# Patient Record
Sex: Female | Born: 1935 | Race: White | Hispanic: No | Marital: Single | Smoking: Never smoker
Health system: Southern US, Community
[De-identification: ages and names within clinical notes are randomized; demographics above are authoritative.]

## PROBLEM LIST (undated history)

## (undated) DIAGNOSIS — M797 Fibromyalgia: Secondary | ICD-10-CM

## (undated) DIAGNOSIS — N23 Unspecified renal colic: Secondary | ICD-10-CM

## (undated) DIAGNOSIS — M4800 Spinal stenosis, site unspecified: Secondary | ICD-10-CM

## (undated) DIAGNOSIS — M1990 Unspecified osteoarthritis, unspecified site: Secondary | ICD-10-CM

## (undated) DIAGNOSIS — M25569 Pain in unspecified knee: Secondary | ICD-10-CM

## (undated) DIAGNOSIS — G8929 Other chronic pain: Secondary | ICD-10-CM

## (undated) HISTORY — PX: HEMORRHOID SURGERY: SHX153

## (undated) HISTORY — PX: ABDOMINAL HYSTERECTOMY: SHX81

## (undated) HISTORY — PX: APPENDECTOMY: SHX54

---

## 2010-08-16 DEATH — deceased

## 2010-10-22 ENCOUNTER — Encounter: Payer: Self-pay | Admitting: Emergency Medicine

## 2010-10-22 ENCOUNTER — Emergency Department (HOSPITAL_BASED_OUTPATIENT_CLINIC_OR_DEPARTMENT_OTHER)
Admission: EM | Admit: 2010-10-22 | Discharge: 2010-10-22 | Disposition: A | Discharge status: Home / Self Care | Payer: Medicare Other | Attending: Emergency Medicine | Admitting: Emergency Medicine

## 2010-10-22 DIAGNOSIS — M549 Dorsalgia, unspecified: Secondary | ICD-10-CM | POA: Insufficient documentation

## 2010-10-22 DIAGNOSIS — M62838 Other muscle spasm: Secondary | ICD-10-CM | POA: Insufficient documentation

## 2010-10-22 MED ORDER — KETOROLAC TROMETHAMINE 30 MG/ML IJ SOLN
INTRAMUSCULAR | Status: AC
  Filled 2010-10-22: qty 1

## 2010-10-22 MED ORDER — DIAZEPAM 5 MG PO TABS
5.0000 mg | ORAL_TABLET | Freq: Once | ORAL | Status: AC
  Administered 2010-10-22: 5 mg via ORAL

## 2010-10-22 MED ORDER — KETOROLAC TROMETHAMINE 30 MG/ML IJ SOLN
30.0000 mg | Freq: Once | INTRAMUSCULAR | Status: AC
  Administered 2010-10-22: 30 mg via INTRAMUSCULAR

## 2010-10-22 MED ORDER — HYDROCODONE-ACETAMINOPHEN 5-325 MG PO TABS
ORAL_TABLET | ORAL | Status: AC
  Filled 2010-10-22: qty 1

## 2010-10-22 MED ORDER — DIAZEPAM 5 MG PO TABS
ORAL_TABLET | ORAL | Status: AC
  Filled 2010-10-22: qty 1

## 2010-10-22 MED ORDER — HYDROCODONE-ACETAMINOPHEN 5-325 MG PO TABS
1.0000 | ORAL_TABLET | Freq: Once | ORAL | Status: AC
  Administered 2010-10-22: 1 via ORAL

## 2010-10-22 NOTE — ED Notes (Signed)
Wtg for [facility]AR for transport

## 2010-10-22 NOTE — ED Provider Notes (Signed)
History     Chief Complaint  Patient presents with  . Back Pain   Patient is a 77 y.o. female presenting with back pain. The history is provided by the patient.  Back Pain  This is a recurrent (acute onset while sitting and watching a show.) problem. The current episode started less than 1 hour ago. The problem has not changed since onset.The pain is associated with no known injury. The pain is present in the gluteal region (right gluteal region). The quality of the pain is described as cramping. The pain does not radiate. The pain is moderate. The symptoms are aggravated by certain positions (palpation). Pertinent negatives include no chest pain, no fever, no numbness, no weight loss, no bowel incontinence, no perianal numbness, no pelvic pain, no leg pain, no paresthesias, no paresis and no weakness. She has tried nothing (previously had relief with valium, cortisone and a pain shot. ) for the symptoms.    Past Medical History  Diagnosis Date  . Diabetes mellitus     Past Surgical History  Procedure Date  . Abdominal hysterectomy   . Hemorrhoid surgery     History reviewed. No pertinent family history.  History  Substance Use Topics  . Smoking status: Never Smoker   . Smokeless tobacco: Not on file  . Alcohol Use: No    OB History    Grav Para Term Preterm Abortions TAB SAB Ect Mult Living                  Review of Systems  Constitutional: Negative for fever, chills, weight loss and appetite change.  Respiratory: Negative for shortness of breath.   Cardiovascular: Negative for chest pain.  Gastrointestinal: Negative for bowel incontinence.  Genitourinary: Negative for pelvic pain.  Musculoskeletal: Positive for back pain. Negative for joint swelling and gait problem.  Neurological: Negative for weakness, numbness and paresthesias.    Physical Exam  BP 113/68  Pulse 102  Temp(Src) 98.1 F (36.7 C) (Oral)  Resp 19  SpO2 96%  Physical Exam  Constitutional: She  appears well-developed and well-nourished.  HENT:  Head: Normocephalic and atraumatic.  Eyes: Pupils are equal, round, and reactive to light.  Neck: Normal range of motion.  Cardiovascular: Normal rate.   Pulmonary/Chest: Effort normal.  Abdominal: Soft. She exhibits no distension and no mass. There is no tenderness.  Musculoskeletal: She exhibits no edema.       Lumbar back: Normal. She exhibits normal range of motion, no pain and no spasm.       Back:    ED Course  Procedures  MDM Cramp in her right buttock area. Improved with treatment, history of same. Will d/c patient states that she has meds at home.       Juliet Rude. Rubin Payor, MD 10/22/10 1754

## 2010-10-22 NOTE — ED Notes (Signed)
ZOX:[id number]<AV> Expected date:10/22/10<BR> Expected time: 4:18 PM<BR> Means of arrival:Ambulance<BR> Comments:<BR> EMS, back spasms

## 2011-10-08 ENCOUNTER — Emergency Department (HOSPITAL_BASED_OUTPATIENT_CLINIC_OR_DEPARTMENT_OTHER)
Admission: EM | Admit: 2011-10-08 | Discharge: 2011-10-08 | Disposition: A | Discharge status: Home / Self Care | Payer: Medicare Other | Attending: Emergency Medicine | Admitting: Emergency Medicine

## 2011-10-08 ENCOUNTER — Encounter (HOSPITAL_BASED_OUTPATIENT_CLINIC_OR_DEPARTMENT_OTHER): Payer: Self-pay | Admitting: *Deleted

## 2011-10-08 DIAGNOSIS — M25559 Pain in unspecified hip: Secondary | ICD-10-CM | POA: Insufficient documentation

## 2011-10-08 NOTE — ED Notes (Signed)
Pt states she has a hx of spasms in her hip. Now c/o pain to the right one.

## 2011-10-08 NOTE — ED Notes (Addendum)
Pt to desk- states sx have resolved- does not wish to wait to see EDP due to length of wait- pt in dept 1 hour 15 minutes

## 2014-08-05 ENCOUNTER — Encounter (HOSPITAL_BASED_OUTPATIENT_CLINIC_OR_DEPARTMENT_OTHER): Payer: Self-pay | Admitting: *Deleted

## 2014-08-05 ENCOUNTER — Emergency Department (HOSPITAL_BASED_OUTPATIENT_CLINIC_OR_DEPARTMENT_OTHER)
Admission: EM | Admit: 2014-08-05 | Discharge: 2014-08-05 | Disposition: A | Discharge status: Home / Self Care | Payer: Medicare Other | Attending: Emergency Medicine | Admitting: Emergency Medicine

## 2014-08-05 ENCOUNTER — Emergency Department (HOSPITAL_BASED_OUTPATIENT_CLINIC_OR_DEPARTMENT_OTHER): Payer: Medicare Other

## 2014-08-05 DIAGNOSIS — Z8739 Personal history of other diseases of the musculoskeletal system and connective tissue: Secondary | ICD-10-CM | POA: Diagnosis not present

## 2014-08-05 DIAGNOSIS — R109 Unspecified abdominal pain: Secondary | ICD-10-CM

## 2014-08-05 DIAGNOSIS — R1030 Lower abdominal pain, unspecified: Secondary | ICD-10-CM | POA: Diagnosis present

## 2014-08-05 DIAGNOSIS — Z90710 Acquired absence of both cervix and uterus: Secondary | ICD-10-CM | POA: Insufficient documentation

## 2014-08-05 DIAGNOSIS — E119 Type 2 diabetes mellitus without complications: Secondary | ICD-10-CM | POA: Diagnosis not present

## 2014-08-05 DIAGNOSIS — Z9049 Acquired absence of other specified parts of digestive tract: Secondary | ICD-10-CM | POA: Insufficient documentation

## 2014-08-05 DIAGNOSIS — N201 Calculus of ureter: Secondary | ICD-10-CM

## 2014-08-05 DIAGNOSIS — Z87448 Personal history of other diseases of urinary system: Secondary | ICD-10-CM | POA: Diagnosis not present

## 2014-08-05 DIAGNOSIS — G8929 Other chronic pain: Secondary | ICD-10-CM | POA: Diagnosis not present

## 2014-08-05 DIAGNOSIS — Z79899 Other long term (current) drug therapy: Secondary | ICD-10-CM | POA: Diagnosis not present

## 2014-08-05 HISTORY — DX: Fibromyalgia: M79.7

## 2014-08-05 HISTORY — DX: Pain in unspecified knee: M25.569

## 2014-08-05 HISTORY — DX: Spinal stenosis, site unspecified: M48.00

## 2014-08-05 HISTORY — DX: Unspecified osteoarthritis, unspecified site: M19.90

## 2014-08-05 HISTORY — DX: Other chronic pain: G89.29

## 2014-08-05 LAB — BASIC METABOLIC PANEL
ANION GAP: 9 (ref 5–15)
BUN: 18 mg/dL (ref 6–23)
CALCIUM: 9.5 mg/dL (ref 8.4–10.5)
CHLORIDE: 109 mmol/L (ref 96–112)
CO2: 24 mmol/L (ref 19–32)
CREATININE: 1.04 mg/dL (ref 0.50–1.10)
GFR calc Af Amer: 56 mL/min — ABNORMAL LOW (ref >90)
GFR calc non Af Amer: 49 mL/min — ABNORMAL LOW (ref >90)
Glucose, Bld: 165 mg/dL — ABNORMAL HIGH (ref 70–99)
Potassium: 4.1 mmol/L (ref 3.5–5.1)
Sodium: 142 mmol/L (ref 135–145)

## 2014-08-05 LAB — CBC WITH DIFFERENTIAL/PLATELET
BASOS ABS: 0 10*3/uL (ref 0.0–0.1)
BASOS PCT: 0 % (ref 0–1)
EOS ABS: 0 10*3/uL (ref 0.0–0.7)
EOS PCT: 0 % (ref 0–5)
HEMATOCRIT: 39 % (ref 36.0–46.0)
Hemoglobin: 12.7 g/dL (ref 12.0–15.0)
Lymphocytes Relative: 13 % (ref 12–46)
Lymphs Abs: 1.4 10*3/uL (ref 0.7–4.0)
MCH: 31.3 pg (ref 26.0–34.0)
MCHC: 32.6 g/dL (ref 30.0–36.0)
MCV: 96.1 fL (ref 78.0–100.0)
MONO ABS: 0.6 10*3/uL (ref 0.1–1.0)
MONOS PCT: 6 % (ref 3–12)
Neutro Abs: 8.9 10*3/uL — ABNORMAL HIGH (ref 1.7–7.7)
Neutrophils Relative %: 81 % — ABNORMAL HIGH (ref 43–77)
PLATELETS: 235 10*3/uL (ref 150–400)
RBC: 4.06 MIL/uL (ref 3.87–5.11)
RDW: 13.9 % (ref 11.5–15.5)
WBC: 10.9 10*3/uL — AB (ref 4.0–10.5)

## 2014-08-05 LAB — URINALYSIS, ROUTINE W REFLEX MICROSCOPIC
BILIRUBIN URINE: NEGATIVE
Glucose, UA: NEGATIVE mg/dL
KETONES UR: 15 mg/dL — AB
NITRITE: NEGATIVE
PH: 5.5 (ref 5.0–8.0)
PROTEIN: NEGATIVE mg/dL
Specific Gravity, Urine: 1.02 (ref 1.005–1.030)
UROBILINOGEN UA: 0.2 mg/dL (ref 0.0–1.0)

## 2014-08-05 LAB — URINE MICROSCOPIC-ADD ON

## 2014-08-05 MED ORDER — HYDROMORPHONE HCL 1 MG/ML IJ SOLN
1.0000 mg | Freq: Once | INTRAMUSCULAR | Status: AC
  Administered 2014-08-05: 1 mg via INTRAVENOUS
  Filled 2014-08-05: qty 1

## 2014-08-05 MED ORDER — TRAMADOL HCL 50 MG PO TABS: 50.0000 mg | ORAL_TABLET | Freq: Four times a day (QID) | ORAL | Status: DC | PRN

## 2014-08-05 NOTE — ED Provider Notes (Signed)
CSN: [id number]     Arrival date & time 08/05/14  [id number] History   First MD Initiated Contact with Patient 08/05/14 0914     Chief Complaint  Patient presents with  . Flank Pain     (Consider location/radiation/quality/duration/timing/severity/associated sxs/prior Treatment) HPI Comments: Patient reports an onset sharp left flank pain beginning about 2 AM this morning.  Preceded by 2 episodes of loose stool and 2 episodes of vomiting.  Pain is constant without radiation.  No known aggravating or alleviating symptoms.  She denies dysuria, hematuria, frequency or decreased urine output.  She is no longer having vomiting or diarrhea.  She has had no fevers or chills.  She has no abdominal pain.  She's a history for prior episodes of kidney stones, all of which have required manipulation  Patient is a 81 y.o. female presenting with flank pain.  Flank Pain Pertinent negatives include no chest pain, no abdominal pain, no headaches and no shortness of breath.    Past Medical History  Diagnosis Date  . Diabetes mellitus   . Renal disorder   . Arthritis   . Fibromyalgia   . Spinal stenosis   . Knee pain, chronic    Past Surgical History  Procedure Laterality Date  . Abdominal hysterectomy    . Hemorrhoid surgery    . Appendectomy     No family history on file. History  Substance Use Topics  . Smoking status: Never Smoker   . Smokeless tobacco: Not on file  . Alcohol Use: No   OB History    No data available     Review of Systems  Constitutional: Negative for fever, chills, diaphoresis, activity change, appetite change and fatigue.  HENT: Negative for congestion, facial swelling, rhinorrhea and sore throat.   Eyes: Negative for photophobia and discharge.  Respiratory: Negative for cough, chest tightness and shortness of breath.   Cardiovascular: Negative for chest pain, palpitations and leg swelling.  Gastrointestinal: Positive for nausea, vomiting and diarrhea. Negative for  abdominal pain.  Endocrine: Negative for polydipsia and polyuria.  Genitourinary: Positive for flank pain. Negative for dysuria, frequency, difficulty urinating and pelvic pain.  Musculoskeletal: Negative for back pain, arthralgias, neck pain and neck stiffness.  Skin: Negative for color change and wound.  Allergic/Immunologic: Negative for immunocompromised state.  Neurological: Negative for facial asymmetry, weakness, numbness and headaches.  Hematological: Does not bruise/bleed easily.  Psychiatric/Behavioral: Negative for confusion and agitation.      Allergies  Lyrica and Sulfa antibiotics  Home Medications   Prior to Admission medications   Medication Sig Start Date End Date Taking? Authorizing Provider  cholecalciferol (VITAMIN D) 1000 UNITS tablet Take 1,000 Units by mouth daily.     Yes Historical Provider, MD  metFORMIN (GLUCOPHAGE) 500 MG tablet Take 1,500 mg by mouth 2 (two) times daily with a meal.   Yes Historical Provider, MD  quinapril (ACCUPRIL) 10 MG tablet Take 10 mg by mouth daily.     Yes Historical Provider, MD  DULoxetine (CYMBALTA) 30 MG capsule Take 30 mg by mouth daily.      Historical Provider, MD  rosuvastatin (CRESTOR) 5 MG tablet Take 5 mg by mouth daily.      Historical Provider, MD  sitaGLIPtin (JANUVIA) 100 MG tablet Take 100 mg by mouth daily.      Historical Provider, MD   There were no vitals taken for this visit. Physical Exam  Constitutional: She is oriented to person, place, and time. She appears well-developed and well-nourished.  No distress.  HENT:  Head: Normocephalic and atraumatic.  Mouth/Throat: No oropharyngeal exudate.  Eyes: Pupils are equal, round, and reactive to light.  Neck: Normal range of motion. Neck supple.  Cardiovascular: Normal rate, regular rhythm and normal heart sounds.  Exam reveals no gallop and no friction rub.   No murmur heard. Pulmonary/Chest: Effort normal and breath sounds normal. No respiratory distress. She  has no wheezes. She has no rales.  Abdominal: Soft. Bowel sounds are normal. She exhibits no distension and no mass. There is no tenderness. There is CVA tenderness (left). There is no rebound and no guarding.  Musculoskeletal: Normal range of motion. She exhibits no edema or tenderness.  Neurological: She is alert and oriented to person, place, and time.  Skin: Skin is warm and dry.  Psychiatric: She has a normal mood and affect.    ED Course  Procedures (including critical care time) Labs Review Labs Reviewed  URINALYSIS, ROUTINE W REFLEX MICROSCOPIC - Abnormal; Notable for the following:    APPearance CLOUDY (*)    Hgb urine dipstick LARGE (*)    Ketones, ur 15 (*)    Leukocytes, UA SMALL (*)    All other components within normal limits  URINE MICROSCOPIC-ADD ON - Abnormal; Notable for the following:    Squamous Epithelial / LPF FEW (*)    Bacteria, UA FEW (*)    Crystals CA OXALATE CRYSTALS (*)    All other components within normal limits  CBC WITH DIFFERENTIAL/PLATELET - Abnormal; Notable for the following:    WBC 10.9 (*)    Neutrophils Relative % 81 (*)    Neutro Abs 8.9 (*)    All other components within normal limits  BASIC METABOLIC PANEL    Imaging Review No results found.   EKG Interpretation None      MDM   Final diagnoses:  Left ureteral stone    Pt is a 81 y.o. female with Pmhx as above who presents with sudden onset left flank pain microscopic hematuria and history of prior stones requiring manipulation prior to onset of pain.  Patient has several loose stools and 2 episodes of vomiting.  Pain is constant, sharp without radiation, similar to prior episodes of kidney stones.  On physical exam, patient is uncomfortable but in no acute distress.  Vital signs stable.  She has left CVA tenderness.  Urine does not appear infected.  She has a mild leukocytosis .  Creatinine is normal .  Pain improved with IV narcotics.  Patient be discharged home with short  course of by mouth narcotics, recommend close outpatient follow-up with her urologist, Dr. Cleatrice Burke.      Lysbeth Penner evaluation in the Emergency Department is complete. It has been determined that no acute conditions requiring further emergency intervention are present at this time. The patient/guardian have been advised of the diagnosis and plan. We have discussed signs and symptoms that warrant return to the ED, such as changes or worsening in symptoms, worsening pain, fever, inability to tolerate liquids.       Toy Cookey, MD 08/05/14 [phone]

## 2014-08-05 NOTE — ED Notes (Addendum)
EMS report pain woke up this morning around 0200 with left flank pain.  Hx of kidney stones. Initially had nausea and vomiting, resolved now.  VS 172/98, HR 70: NSR, O2Sat 94%, CBG 204.

## 2014-08-05 NOTE — Discharge Instructions (Signed)

## 2014-08-08 LAB — URINE CULTURE: Colony Count: 70000

## 2014-08-10 ENCOUNTER — Telehealth (HOSPITAL_COMMUNITY): Payer: Self-pay

## 2014-08-10 NOTE — Telephone Encounter (Signed)
Post ED Visit - Positive Culture Follow-up  Culture report reviewed by antimicrobial stewardship pharmacist: []  Wes Dulaney, Pharm.D., BCPS [x]  Celedonio MiyamotoJeremy Frens, Pharm.D., BCPS []  Georgina PillionElizabeth Martin, Pharm.D., BCPS []  ForneyMinh Pham, [address]Ph[address]D., BCPS, AAHIVP []  Estella HuskMichelle Turner, Pharm.D., BCPS, AAHIVP []  Elder CyphersLorie Poole, [address]Pharm[address]D., BCPS  Positive Urine culture, 70,000 colonies -> Klebsiella Pneumoniae Chart reviewed by Ladona MowJoe Mintz PA-C "no treatment needed".  Arvid RightClark, Lucindy Borel Dorn 08/10/2014, 2:49 AM

## 2014-08-17 ENCOUNTER — Emergency Department (HOSPITAL_BASED_OUTPATIENT_CLINIC_OR_DEPARTMENT_OTHER)
Admission: EM | Admit: 2014-08-17 | Discharge: 2014-08-17 | Disposition: A | Discharge status: Home / Self Care | Payer: Medicare Other | Attending: Emergency Medicine | Admitting: Emergency Medicine

## 2014-08-17 ENCOUNTER — Emergency Department (HOSPITAL_BASED_OUTPATIENT_CLINIC_OR_DEPARTMENT_OTHER): Payer: Medicare Other

## 2014-08-17 ENCOUNTER — Encounter (HOSPITAL_BASED_OUTPATIENT_CLINIC_OR_DEPARTMENT_OTHER): Payer: Self-pay | Admitting: Emergency Medicine

## 2014-08-17 DIAGNOSIS — E119 Type 2 diabetes mellitus without complications: Secondary | ICD-10-CM | POA: Diagnosis not present

## 2014-08-17 DIAGNOSIS — M1990 Unspecified osteoarthritis, unspecified site: Secondary | ICD-10-CM | POA: Diagnosis not present

## 2014-08-17 DIAGNOSIS — G8929 Other chronic pain: Secondary | ICD-10-CM | POA: Diagnosis not present

## 2014-08-17 DIAGNOSIS — Z79899 Other long term (current) drug therapy: Secondary | ICD-10-CM | POA: Insufficient documentation

## 2014-08-17 DIAGNOSIS — M797 Fibromyalgia: Secondary | ICD-10-CM | POA: Diagnosis not present

## 2014-08-17 DIAGNOSIS — Z87448 Personal history of other diseases of urinary system: Secondary | ICD-10-CM | POA: Diagnosis not present

## 2014-08-17 DIAGNOSIS — N3001 Acute cystitis with hematuria: Secondary | ICD-10-CM | POA: Diagnosis not present

## 2014-08-17 DIAGNOSIS — R300 Dysuria: Secondary | ICD-10-CM | POA: Diagnosis present

## 2014-08-17 HISTORY — DX: Unspecified renal colic: N23

## 2014-08-17 LAB — URINE MICROSCOPIC-ADD ON

## 2014-08-17 LAB — URINALYSIS, ROUTINE W REFLEX MICROSCOPIC
GLUCOSE, UA: NEGATIVE mg/dL
Ketones, ur: 15 mg/dL — AB
NITRITE: POSITIVE — AB
PH: 5 (ref 5.0–8.0)
Protein, ur: NEGATIVE mg/dL
SPECIFIC GRAVITY, URINE: 1.013 (ref 1.005–1.030)
UROBILINOGEN UA: 1 mg/dL (ref 0.0–1.0)

## 2014-08-17 MED ORDER — CEPHALEXIN 500 MG PO CAPS: 500.0000 mg | ORAL_CAPSULE | Freq: Two times a day (BID) | ORAL | Status: DC

## 2014-08-17 MED ORDER — DEXTROSE 5 % IV SOLN
1.0000 g | Freq: Once | INTRAVENOUS | Status: AC
  Administered 2014-08-17: 1 g via INTRAVENOUS

## 2014-08-17 MED ORDER — FENTANYL CITRATE (PF) 100 MCG/2ML IJ SOLN
50.0000 ug | Freq: Once | INTRAMUSCULAR | Status: AC
  Administered 2014-08-17: 50 ug via INTRAVENOUS
  Filled 2014-08-17: qty 2

## 2014-08-17 MED ORDER — SODIUM CHLORIDE 0.9 % IV SOLN
INTRAVENOUS | Status: DC
  Administered 2014-08-17: 07:00:00 via INTRAVENOUS

## 2014-08-17 MED ORDER — CEFTRIAXONE SODIUM 1 G IJ SOLR
INTRAMUSCULAR | Status: AC
Start: 2014-08-17 — End: 2014-08-17
  Filled 2014-08-17: qty 10

## 2014-08-17 NOTE — ED Provider Notes (Signed)
CSN: [id number]     Arrival date & time 08/17/14  0636 History   None    Chief Complaint  Patient presents with  . Dysuria     (Consider location/radiation/quality/duration/timing/severity/associated sxs/prior Treatment) HPI This is an 81 year old female who was diagnosed on April 20 of this year with punctate distal left ureteral stone. Urinalysis grew out Klebsiella, 70,000 colonies; she was not placed on an antibiotic.  She returns with urinary urgency, urinary frequency and voiding small amounts that began about 3 AM today. She took 2 Azo tablets without relief. She is having moderate to severe pressure in her bladder which she states feels like a urinary tract infection not like another kidney stone. Pain is worse with urination or palpation of the bladder. She is having some mild pain in her left flank. She denies nausea, vomiting fever or chills. She notes her urine to have turned orange from the Azo but has not observed blood.  Past Medical History  Diagnosis Date  . Diabetes mellitus   . Arthritis   . Fibromyalgia   . Spinal stenosis   . Knee pain, chronic   . Ureteral colic    Past Surgical History  Procedure Laterality Date  . Abdominal hysterectomy    . Hemorrhoid surgery    . Appendectomy     History reviewed. No pertinent family history. History  Substance Use Topics  . Smoking status: Never Smoker   . Smokeless tobacco: Not on file  . Alcohol Use: No   OB History    No data available     Review of Systems  All other systems reviewed and are negative.   Allergies  Lyrica and Sulfa antibiotics  Home Medications   Prior to Admission medications   Medication Sig Start Date End Date Taking? Authorizing Provider  cholecalciferol (VITAMIN D) 1000 UNITS tablet Take 1,000 Units by mouth daily.      Historical Provider, MD  DULoxetine (CYMBALTA) 30 MG capsule Take 30 mg by mouth daily.      Historical Provider, MD  metFORMIN (GLUCOPHAGE) 500 MG tablet Take  1,500 mg by mouth 2 (two) times daily with a meal.    Historical Provider, MD  quinapril (ACCUPRIL) 10 MG tablet Take 10 mg by mouth daily.      Historical Provider, MD  rosuvastatin (CRESTOR) 5 MG tablet Take 5 mg by mouth daily.      Historical Provider, MD  sitaGLIPtin (JANUVIA) 100 MG tablet Take 100 mg by mouth daily.      Historical Provider, MD  traMADol (ULTRAM) 50 MG tablet Take 1 tablet (50 mg total) by mouth every 6 (six) hours as needed. 08/05/14   Toy Cookey, MD   BP 153/71 mmHg  Pulse 91  Temp(Src) 97.7 F (36.5 C) (Oral)  Resp 18  SpO2 98%   Physical Exam  General: Well-developed, well-nourished female in no acute distress; appearance consistent with age of record HENT: normocephalic; atraumatic Eyes: pupils equal, round and reactive to light; extraocular muscles intact Neck: supple Heart: regular rate and rhythm Lungs: clear to auscultation bilaterally Abdomen: soft; nondistended; suprapubic tenderness; no masses or hepatosplenomegaly; bowel sounds present GU: Slight left CVA tenderness; urine orange and cloudy Extremities: No deformity; full range of motion; pulses normal; trace edema of lower legs Neurologic: Awake, alert and oriented; motor function intact in all extremities and symmetric; no facial droop Skin: Warm and dry Psychiatric: Normal mood and affect    ED Course  Procedures (including critical care time)  MDM  Nursing notes and vitals signs, including pulse oximetry, reviewed.  Summary of this visit's results, reviewed by myself:  Labs:  Results for orders placed or performed during the hospital encounter of 08/17/14 (from the past 24 hour(s))  Urinalysis, Routine w reflex microscopic     Status: Abnormal   Collection Time: 08/17/14  6:45 AM  Result Value Ref Range   Color, Urine RED (A) YELLOW   APPearance CLOUDY (A) CLEAR   Specific Gravity, Urine 1.013 1.005 - 1.030   pH 5.0 5.0 - 8.0   Glucose, UA NEGATIVE NEGATIVE mg/dL   Hgb urine  dipstick LARGE (A) NEGATIVE   Bilirubin Urine SMALL (A) NEGATIVE   Ketones, ur 15 (A) NEGATIVE mg/dL   Protein, ur NEGATIVE NEGATIVE mg/dL   Urobilinogen, UA 1.0 0.0 - 1.0 mg/dL   Nitrite POSITIVE (A) NEGATIVE   Leukocytes, UA MODERATE (A) NEGATIVE  Urine microscopic-add on     Status: Abnormal   Collection Time: 08/17/14  6:45 AM  Result Value Ref Range   Squamous Epithelial / LPF FEW (A) RARE   WBC, UA 7-10 <3 WBC/hpf   RBC / HPF 21-50 <3 RBC/hpf   Bacteria, UA FEW (A) RARE   Renal U/S pending. Rocephin ordered for UTI. Dr. Jodi MourningZavitz will follow up on results and make disposition.    Paula LibraJohn Virgilia Quigg, MD 08/17/14 [phone]

## 2014-08-17 NOTE — Discharge Instructions (Signed)
Call your urologist today to be seen more urgently than originally scheduled appointment. The swelling in your kidney has improved, you have a urine infection. You have an appointment with a urologist today at 1:45 PM. Addresses [address] If you were given medicines take as directed.  If you are on coumadin or contraceptives realize their levels and effectiveness is altered by many different medicines.  If you have any reaction (rash, tongues swelling, other) to the medicines stop taking and see a physician.   Please follow up as directed and return to the ER or see a physician for new or worsening symptoms.  Thank you. Filed Vitals:   08/17/14 0646 08/17/14 0915  BP: 153/71 182/60  Pulse: 91 88  Temp: 97.7 F (36.5 C)   TempSrc: Oral   Resp: 18 18  SpO2: 98% 97%

## 2014-08-17 NOTE — ED Notes (Addendum)
Pt c/o pain with urination and has hx of kidney stone denies visible blood in urine also reports back / flank pain and pain with urination

## 2014-08-17 NOTE — ED Notes (Signed)
Patient transported to ultrasound.

## 2014-08-21 LAB — URINE CULTURE

## 2014-08-22 ENCOUNTER — Telehealth: Payer: Self-pay | Admitting: Emergency Medicine

## 2014-08-22 NOTE — Telephone Encounter (Signed)
Post ED Visit - Positive Culture Follow-up  Culture report reviewed by antimicrobial stewardship pharmacist: []  Wes Dulaney, Pharm.D., BCPS []  Celedonio MiyamotoJeremy Frens, [address]Pharm[address] BCPS []  Georgina PillionElizabeth Martin, Pharm.D., BCPS []  HadarMinh Pham, [address]Pharm[address]D., BCPS, AAHIVP []  Estella HuskMichelle Turner, Pharm.D., BCPS, AAHIVP [x]  Christoper Fabianaron Amend, [address]Ph[address] BCPS  Positive Urine culture Treated with Cephalexin, organism sensitive to the same and no further patient follow-up is required at this time.  Jiles HaroldGammons, Ardyn Forge Chaney 08/22/2014, 2:57 PM

## 2016-10-15 DEATH — deceased

## 2022-09-16 DEATH — deceased
